# Patient Record
Sex: Female | Born: 1995 | Race: Black or African American | Hispanic: No | Marital: Single | Smoking: Never smoker
Health system: Southern US, Community
[De-identification: ages and names within clinical notes are randomized; demographics above are authoritative.]

## PROBLEM LIST (undated history)

## (undated) ENCOUNTER — Inpatient Hospital Stay (HOSPITAL_COMMUNITY): Payer: Self-pay

## (undated) DIAGNOSIS — I10 Essential (primary) hypertension: Secondary | ICD-10-CM

## (undated) HISTORY — PX: PILONIDAL CYST EXCISION: SHX744

---

## 2018-04-08 ENCOUNTER — Observation Stay (HOSPITAL_BASED_OUTPATIENT_CLINIC_OR_DEPARTMENT_OTHER): Payer: Medicaid - Out of State

## 2018-04-08 ENCOUNTER — Encounter (HOSPITAL_COMMUNITY): Payer: Self-pay

## 2018-04-08 ENCOUNTER — Other Ambulatory Visit: Payer: Self-pay

## 2018-04-08 ENCOUNTER — Observation Stay (HOSPITAL_COMMUNITY)
Admission: EM | Admit: 2018-04-08 | Discharge: 2018-04-09 | Disposition: A | Discharge status: Home / Self Care | Payer: Medicaid - Out of State | Attending: Obstetrics and Gynecology | Admitting: Obstetrics and Gynecology

## 2018-04-08 DIAGNOSIS — O1493 Unspecified pre-eclampsia, third trimester: Principal | ICD-10-CM | POA: Insufficient documentation

## 2018-04-08 DIAGNOSIS — Z3A35 35 weeks gestation of pregnancy: Secondary | ICD-10-CM | POA: Insufficient documentation

## 2018-04-08 DIAGNOSIS — I10 Essential (primary) hypertension: Secondary | ICD-10-CM | POA: Diagnosis present

## 2018-04-08 DIAGNOSIS — O1490 Unspecified pre-eclampsia, unspecified trimester: Secondary | ICD-10-CM | POA: Diagnosis present

## 2018-04-08 HISTORY — DX: Essential (primary) hypertension: I10

## 2018-04-08 LAB — CBC
HCT: 37.3 % (ref 36.0–46.0)
Hemoglobin: 11.3 g/dL — ABNORMAL LOW (ref 12.0–15.0)
MCH: 26.2 pg (ref 26.0–34.0)
MCHC: 30.3 g/dL (ref 30.0–36.0)
MCV: 86.5 fL (ref 80.0–100.0)
Platelets: 259 10*3/uL (ref 150–400)
RBC: 4.31 MIL/uL (ref 3.87–5.11)
RDW: 12.9 % (ref 11.5–15.5)
WBC: 8.5 10*3/uL (ref 4.0–10.5)
nRBC: 0 % (ref 0.0–0.2)

## 2018-04-08 LAB — TYPE AND SCREEN
ABO/RH(D): B POS
Antibody Screen: NEGATIVE

## 2018-04-08 LAB — URINALYSIS, ROUTINE W REFLEX MICROSCOPIC
Bilirubin Urine: NEGATIVE
Glucose, UA: NEGATIVE mg/dL
Hgb urine dipstick: NEGATIVE
Ketones, ur: NEGATIVE mg/dL
NITRITE: NEGATIVE
Protein, ur: NEGATIVE mg/dL
Specific Gravity, Urine: 1.004 — ABNORMAL LOW (ref 1.005–1.030)
Squamous Epithelial / HPF: >50 — ABNORMAL HIGH (ref 0–5)
WBC, UA: >50 WBC/hpf — ABNORMAL HIGH (ref 0–5)
pH: 7 (ref 5.0–8.0)

## 2018-04-08 LAB — COMPREHENSIVE METABOLIC PANEL
ALK PHOS: 128 U/L — AB (ref 38–126)
ALT: 11 U/L (ref 0–44)
ANION GAP: 11 (ref 5–15)
AST: 17 U/L (ref 15–41)
Albumin: 2.6 g/dL — ABNORMAL LOW (ref 3.5–5.0)
BUN: <5 mg/dL — ABNORMAL LOW (ref 6–20)
CO2: 22 mmol/L (ref 22–32)
Calcium: 9.1 mg/dL (ref 8.9–10.3)
Chloride: 106 mmol/L (ref 98–111)
Creatinine, Ser: 0.6 mg/dL (ref 0.44–1.00)
GFR calc non Af Amer: >60 mL/min (ref >60)
Glucose, Bld: 90 mg/dL (ref 70–99)
Potassium: 3.1 mmol/L — ABNORMAL LOW (ref 3.5–5.1)
Sodium: 139 mmol/L (ref 135–145)
Total Bilirubin: 0.5 mg/dL (ref 0.3–1.2)
Total Protein: 6.1 g/dL — ABNORMAL LOW (ref 6.5–8.1)

## 2018-04-08 LAB — PROTEIN / CREATININE RATIO, URINE
Creatinine, Urine: 40.27 mg/dL
Protein Creatinine Ratio: 0.35 mg/mg{Cre} — ABNORMAL HIGH (ref 0.00–0.15)
Total Protein, Urine: 14 mg/dL

## 2018-04-08 LAB — HEMOGLOBIN A1C
Hgb A1c MFr Bld: 4.7 % — ABNORMAL LOW (ref 4.8–5.6)
Mean Plasma Glucose: 88.19 mg/dL

## 2018-04-08 MED ORDER — ACETAMINOPHEN 325 MG PO TABS
650.0000 mg | ORAL_TABLET | Freq: Once | ORAL | Status: AC
  Administered 2018-04-08: 650 mg via ORAL
  Filled 2018-04-08: qty 2

## 2018-04-08 MED ORDER — PRENATAL MULTIVITAMIN CH
1.0000 | ORAL_TABLET | Freq: Every day | ORAL | Status: DC
  Administered 2018-04-09: 1 via ORAL
  Filled 2018-04-08: qty 1

## 2018-04-08 MED ORDER — ACETAMINOPHEN 325 MG PO TABS
650.0000 mg | ORAL_TABLET | ORAL | Status: DC | PRN
  Administered 2018-04-09 (×2): 650 mg via ORAL
  Filled 2018-04-08 (×2): qty 2

## 2018-04-08 MED ORDER — POTASSIUM CHLORIDE CRYS ER 20 MEQ PO TBCR
20.0000 meq | EXTENDED_RELEASE_TABLET | Freq: Two times a day (BID) | ORAL | Status: DC
  Administered 2018-04-08 – 2018-04-09 (×2): 20 meq via ORAL
  Filled 2018-04-08 (×5): qty 1

## 2018-04-08 MED ORDER — BUTALBITAL-APAP-CAFFEINE 50-325-40 MG PO TABS
1.0000 | ORAL_TABLET | ORAL | Status: DC | PRN
  Administered 2018-04-08: 1 via ORAL
  Filled 2018-04-08: qty 1

## 2018-04-08 MED ORDER — DOCUSATE SODIUM 100 MG PO CAPS
100.0000 mg | ORAL_CAPSULE | Freq: Every day | ORAL | Status: DC
  Administered 2018-04-09: 100 mg via ORAL
  Filled 2018-04-08: qty 1

## 2018-04-08 MED ORDER — LACTATED RINGERS IV SOLN
INTRAVENOUS | Status: DC
  Administered 2018-04-08 – 2018-04-09 (×2): via INTRAVENOUS

## 2018-04-08 MED ORDER — SODIUM CHLORIDE 0.9 % IV BOLUS
1000.0000 mL | Freq: Once | INTRAVENOUS | Status: AC
  Administered 2018-04-08: 1000 mL via INTRAVENOUS

## 2018-04-08 MED ORDER — CALCIUM CARBONATE ANTACID 500 MG PO CHEW
2.0000 | CHEWABLE_TABLET | ORAL | Status: DC | PRN
  Administered 2018-04-08 – 2018-04-09 (×2): 400 mg via ORAL
  Filled 2018-04-08 (×2): qty 2

## 2018-04-08 MED ORDER — SODIUM CHLORIDE 0.9 % IV SOLN
1.0000 g | Freq: Once | INTRAVENOUS | Status: AC
  Administered 2018-04-08: 1 g via INTRAVENOUS
  Filled 2018-04-08: qty 10

## 2018-04-08 MED ORDER — BUTALBITAL-APAP-CAFFEINE 50-325-40 MG PO TABS: 2.0000 | ORAL_TABLET | Freq: Four times a day (QID) | ORAL | Status: DC | PRN

## 2018-04-08 MED ORDER — ZOLPIDEM TARTRATE 5 MG PO TABS: 5.0000 mg | ORAL_TABLET | Freq: Every evening | ORAL | Status: DC | PRN

## 2018-04-08 NOTE — Progress Notes (Signed)
Spoke with Dr. Earlene Plateravis. Pt is rating her headache a 3 now from a 9 30- 40 min ago. [facility], [facility] for observation.

## 2018-04-08 NOTE — Progress Notes (Signed)
Dr. Rosalia Hammersay notified that pt is to be transferred to [facility], OB High Risk unit room 309.

## 2018-04-08 NOTE — ED Triage Notes (Signed)
Pt c/o HA x3 days and pain in lower abd; pt states that she doesn't feel movement as often as usual. Pt states that she feels really nauseaous

## 2018-04-08 NOTE — Progress Notes (Signed)
Spoke with Dr. Earlene Plateravis. Pt's uc's have become more regular, every 3-5 min. She does not want to give her any meds to stop uc's.

## 2018-04-08 NOTE — Progress Notes (Signed)
Spoke with Dr. Earlene Plateravis. Pt is a G1P0 at 35 5/[redacted] weeks gestation here with c/o a headache x three days and lower abd pain. FHR baseline is 145 BPM, mod variability, accels, no decels and irregular uc's. Cervix is 1cm, 50 %, ? vertex presentation at -2 station. No vaginal bleeding or leaking of fluid. Dr. Earlene Plateravis is reviewing the chart and says that if pt's headache is not relieved with tylenol, she will need [facility] for [facility].

## 2018-04-08 NOTE — ED Notes (Signed)
[facility] called for transport to [facility] 

## 2018-04-08 NOTE — Progress Notes (Signed)
Pt is a G1P0 at 35 5/[redacted] weeks gestation here with c/o headache x three days. Pt denies blurred vision or spots before her eyes at this time. She says that she has had some blurry vision and spots at times over the past three days. Pt says that she does wear glasses. She is also c/o lower abd pain and says she has not been feeling her baby move as much. [place] she says she has been getting regular prenatal care.  Pt denies vaginal bleeding or leaking of fluid. Blood pressures are elevated, but are not in the severe range. Denies epigastric pain. Reflexes are 1 plus with no clonus. Pt says she has not had a cold or any sinus problems.

## 2018-04-08 NOTE — ED Notes (Signed)
Pt checking in for lower abdominal pain, [redacted] weeks pregnant first pregnancy. She also reports decreased fetal movement. Denies vaginal bleeding or vaginal fluid.

## 2018-04-08 NOTE — H&P (Signed)
Obstetric History and Physical  Ronda Fairlyatyana Vater is a 22 y.o. G1P0 with IUP at 5451w5d presenting for not feeling well, some nausea and heartburn. Patient states she has been having  Irregular contractions, none vaginal bleeding, intact membranes, with active fetal movement.    Prenatal Course [facility] with onset of care at 14 weeks, records not available, patient reports she has had regular care and denies any issues Pregnancy complications or risks: Patient Active Problem List   Diagnosis Date Noted  . Pre-eclampsia 04/08/2018  . Chronic hypertension    Medical History:  Past Medical History:  Diagnosis Date  . Chronic hypertension     Past Surgical History:  Procedure Laterality Date  . PILONIDAL CYST EXCISION      OB History  Gravida Para Term Preterm AB Living  1            SAB TAB Ectopic Multiple Live Births               # Outcome Date GA Lbr Len/2nd Weight Sex Delivery Anes PTL Lv  1 Current             Social History   Socioeconomic History  . Marital status: Single    Spouse name: Not on file  . Number of children: Not on file  . Years of education: Not on file  . Highest education level: Not on file  Occupational History  . Not on file  Social Needs  . Financial resource strain: Not on file  . Food insecurity:    Worry: Not on file    Inability: Not on file  . Transportation needs:    Medical: Not on file    Non-medical: Not on file  Tobacco Use  . Smoking status: Never Smoker  . Smokeless tobacco: Never Used  Substance and Sexual Activity  . Alcohol use: Not on file  . Drug use: Not on file  . Sexual activity: Not on file  Lifestyle  . Physical activity:    Days per week: Not on file    Minutes per session: Not on file  . Stress: Not on file  Relationships  . Social connections:    Talks on phone: Not on file    Gets together: Not on file    Attends religious service: Not on file    Active member of club or  organization: Not on file    Attends meetings of clubs or organizations: Not on file    Relationship status: Not on file  Other Topics Concern  . Not on file  Social History Narrative  . Not on file    History reviewed. No pertinent family history.  Medications Prior to Admission  Medication Sig Dispense Refill Last Dose  . acetaminophen (TYLENOL) 500 MG tablet Take 500 mg by mouth every 6 (six) hours as needed for headache (pain).   04/08/2018 at am  . fexofenadine-pseudoephedrine (ALLEGRA-D 24) 180-240 MG 24 hr tablet Take 1 tablet by mouth daily.   04/06/2018 at am  . ondansetron (ZOFRAN) 8 MG tablet Take 8 mg by mouth every 8 (eight) hours as needed for nausea or vomiting.    04/08/2018 at am    Allergies  Allergen Reactions  . Pertussis Vaccines Rash and Other (See Comments)    High fever    Review of Systems: Negative except for what is mentioned in HPI.  Physical Exam: BP (!) 153/101 (BP Location: Right Arm)   Pulse 89  Temp 98.4 F (36.9 C) (Oral)   Resp 18   Ht 5\' 5"  (1.651 m)   Wt 99.3 kg   LMP 08/01/2017 (Exact Date) Comment: April 2019  SpO2 100%   BMI 36.44 kg/m  CONSTITUTIONAL: Well-developed, well-nourished female in no acute distress.  HENT:  Normocephalic, atraumatic, External right and left ear normal. Oropharynx is clear and moist EYES: Conjunctivae and EOM are normal. Pupils are equal, round, and reactive to light. No scleral icterus.  NECK: Normal range of motion, supple, no masses SKIN: Skin is warm and dry. No rash noted. Not diaphoretic. No erythema. No pallor. NEUROLOGIC: Alert and oriented to person, place, and time. Normal reflexes, muscle tone coordination. No cranial nerve deficit noted. PSYCHIATRIC: Normal mood and affect. Normal behavior. Normal judgment and thought content. CARDIOVASCULAR: Normal heart rate noted, regular rhythm RESPIRATORY: Effort and breath sounds normal, no problems with respiration noted ABDOMEN: Soft, nontender,  nondistended, gravid. MUSCULOSKELETAL: Normal range of motion. No edema and no tenderness. 2+ distal pulses.  Cervical Exam: 1/50/-2 Presentation: cephalic by palpation FHT:  Baseline rate 130 bpm   Variability moderate  Accelerations present   Decelerations none Contractions: occasional ctx   Pertinent Labs/Studies:   Results for orders placed or performed during the hospital encounter of 04/08/18 (from the past 24 hour(s))  CBC     Status: Abnormal   Collection Time: 04/08/18  1:20 PM  Result Value Ref Range   WBC 8.5 4.0 - 10.5 K/uL   RBC 4.31 3.87 - 5.11 MIL/uL   Hemoglobin 11.3 (L) 12.0 - 15.0 g/dL   HCT 40.9 81.1 - 91.4 %   MCV 86.5 80.0 - 100.0 fL   MCH 26.2 26.0 - 34.0 pg   MCHC 30.3 30.0 - 36.0 g/dL   RDW 78.2 95.6 - 21.3 %   Platelets 259 150 - 400 K/uL   nRBC 0.0 0.0 - 0.2 %  Comprehensive metabolic panel     Status: Abnormal   Collection Time: 04/08/18  1:20 PM  Result Value Ref Range   Sodium 139 135 - 145 mmol/L   Potassium 3.1 (L) 3.5 - 5.1 mmol/L   Chloride 106 98 - 111 mmol/L   CO2 22 22 - 32 mmol/L   Glucose, Bld 90 70 - 99 mg/dL   BUN <5 (L) 6 - 20 mg/dL   Creatinine, Ser 0.86 0.44 - 1.00 mg/dL   Calcium 9.1 8.9 - 57.8 mg/dL   Total Protein 6.1 (L) 6.5 - 8.1 g/dL   Albumin 2.6 (L) 3.5 - 5.0 g/dL   AST 17 15 - 41 U/L   ALT 11 0 - 44 U/L   Alkaline Phosphatase 128 (H) 38 - 126 U/L   Total Bilirubin 0.5 0.3 - 1.2 mg/dL   GFR calc non Af Amer >60 >60 mL/min   GFR calc Af Amer >60 >60 mL/min   Anion gap 11 5 - 15  Urinalysis, Routine w reflex microscopic     Status: Abnormal   Collection Time: 04/08/18  1:20 PM  Result Value Ref Range   Color, Urine YELLOW YELLOW   APPearance CLOUDY (A) CLEAR   Specific Gravity, Urine 1.004 (L) 1.005 - 1.030   pH 7.0 5.0 - 8.0   Glucose, UA NEGATIVE NEGATIVE mg/dL   Hgb urine dipstick NEGATIVE NEGATIVE   Bilirubin Urine NEGATIVE NEGATIVE   Ketones, ur NEGATIVE NEGATIVE mg/dL   Protein, ur NEGATIVE NEGATIVE mg/dL    Nitrite NEGATIVE NEGATIVE   Leukocytes, UA LARGE (A) NEGATIVE  RBC / HPF 11-20 0 - 5 RBC/hpf   WBC, UA >50 (H) 0 - 5 WBC/hpf   Bacteria, UA RARE (A) NONE SEEN   Squamous Epithelial / LPF >50 (H) 0 - 5   WBC Clumps PRESENT   Protein / creatinine ratio, urine     Status: Abnormal   Collection Time: 04/08/18  1:20 PM  Result Value Ref Range   Creatinine, Urine 40.27 mg/dL   Total Protein, Urine 14 mg/dL   Protein Creatinine Ratio 0.35 (H) 0.00 - 0.15 mg/mg[Cre]  Type and screen [facility]     Status: None   Collection Time: 04/08/18  4:37 PM  Result Value Ref Range   ABO/RH(D) B POS    Antibody Screen NEG    Sample Expiration      04/11/2018 Performed at [facility], [address], [place], [place] [address]     Assessment : Kadajah Kjos is a 22 y.o. G1P0 at [redacted]w[redacted]d being admitted for overnight observation for elevated BP with proteinuria. Patient reports remote history of chronic hypertension, not on meds, reports mildly elevated BP at visit 2 weeks ago but not on meds and has not been followed specially for BP. Had headache at [facility] which improved with tylenol, kept for observation given BP and headache. Will watch BP overnight and treat headache, if improves, will plan for dc home in am. If headache or BP worsens, will admit for induction of labor for chronic hypertension with superimposed preeclampsia with severe features. Patient verbalizes understanding and is in agreement with plan.  Plan:  FWB  - Cat I fetal heart tracing   - GBS pending  cHTN with si PEC - IV labetalol per protocol for BP management - MgSO4 for PEC with severe features For induction if develops severe features   K. Therese Sarah, M.D. Attending [facility] [employer] (Faculty Practice)  04/08/2018, 7:29 PM

## 2018-04-08 NOTE — ED Provider Notes (Signed)
[facility] EMERGENCY [facility]PARTMENT Provider Note   CSN: [id number] Arrival date & time: 04/08/18  1314     History   Chief Complaint Chief Complaint  Patient presents with  . Headache    HPI Susan Hooper is a 22 y.o. female.  HPI  22 year old female G1 P0 LMP 08/11/2017 35 weeks 5 days gestation presents today stating that she has been having a headache for the past 3 days.  Is generalized in nature.  She denies any previous hypertension with this pregnancy.  She feels the baby is not moving as much she has and she has some pressure in the suprapubic region.  She has been nauseated but has not vomited.  States she has been taking Zofran throughout her pregnancy due to nausea.  Not noted any fever chills, visual changes, chest pain, or changes in urinary symptoms.  She has had some increasing dyspnea.  She describes some right upper quadrant discomfort.  She has also noted increased swelling of her legs.  Reports that she has had previous prenatal care in [place].  She is here visiting.  She reports prenatal care throughout her pregnancy with no problems.  History reviewed. No pertinent past medical history.  There are no active problems to display for this patient.   The histories are not reviewed yet. Please review them in the "History" navigator section and refresh this SmartLink.   OB History    Gravida  1   Para      Term      Preterm      AB      Living        SAB      TAB      Ectopic      Multiple      Live Births               Home Medications    Prior to Admission medications   Not on File    Family History History reviewed. No pertinent family history.  Social History Social History   Tobacco Use  . Smoking status: Not on file  Substance Use Topics  . Alcohol use: Not on file  . Drug use: Not on file     Allergies   Patient has no allergy information on record.   Review of Systems Review of Systems  All  other systems reviewed and are negative.    Physical Exam Updated Vital Signs BP (!) 153/100 (BP Location: Right Arm)   Pulse 99   Temp 98.6 F (37 C) (Oral)   Resp 20   LMP  (Within Months) Comment: April 2019  SpO2 100%   Physical Exam  Constitutional: She is oriented to person, place, and time. She appears well-developed and well-nourished. She appears lethargic.  HENT:  Head: Normocephalic.  Mouth/Throat: Oropharynx is clear and moist.  Eyes: Pupils are equal, round, and reactive to light. EOM are normal.  Neck: Normal range of motion.  Cardiovascular: Normal rate and regular rhythm.  Pulmonary/Chest: Effort normal.  Abdominal: Soft. Bowel sounds are normal.  Genitourinary:  Genitourinary Comments: Gravid uterus appears small for dates without tenderness palpation  Musculoskeletal: Normal range of motion.  Neurological: She is oriented to person, place, and time. She has normal strength. She appears lethargic. She displays a negative Romberg sign. Coordination and gait normal.  Skin: Skin is warm. Capillary refill takes less than 2 seconds.  Nursing note and vitals reviewed.  Pelvic exam done per rapid response  nurse  ED Treatments / Results  Labs (all labs ordered are listed, but only abnormal results are displayed) Labs Reviewed  CBC  COMPREHENSIVE METABOLIC PANEL  URINALYSIS, ROUTINE W REFLEX MICROSCOPIC  PROTEIN / CREATININE RATIO, URINE    EKG None  Radiology No results found.  Procedures .Critical Care Performed by: Margarita Grizzle, MD Authorized by: Margarita Grizzle, MD   Critical care provider statement:    Critical care time (minutes):  45   Critical care end time:  04/08/2018 3:30 PM   Critical care was time spent personally by me on the following activities:  Discussions with consultants, evaluation of patient's response to treatment, examination of patient, ordering and performing treatments and interventions, ordering and review of laboratory  studies, ordering and review of radiographic studies, pulse oximetry, re-evaluation of patient's condition, obtaining history from patient or surrogate and review of old charts   (including critical care time)  Medications Ordered in ED Medications  sodium chloride 0.9 % bolus 1,000 mL (1,000 mLs Intravenous New Bag/Given 04/08/18 1344)     Initial Impression / Assessment and Plan / ED Course  I have reviewed the triage vital signs and the nursing notes.  Pertinent labs & imaging results that were available during my care of the patient were reviewed by me and considered in my medical decision making (see chart for details).    22 year old G1, P0 35 weeks 4 days presents today with headache, hypertension, increased protein creatinine ratio.  Headache initially 8-9 out of 10 and has decreased to 3 out of 10 with Tylenol. Liver function tests and platelets are normal. Patient with greater than 50 white blood cells in urine and large leukocytes.  However does appear contaminated.  She is given 1 g of Rocephin and 1 L of normal saline here.  Urine is cultured. Patient with concern for preeclampsia.  Blood pressure currently 139/89 without intervention.  Headache has been improving with Tylenol only. Vitals:   04/08/18 1500 04/08/18 1515  BP: 139/89 140/88  Pulse:    Resp:    Temp:    SpO2:     Patient to be transferred to [facility] Dr. Jinny Sanders service.  Final Clinical Impressions(s) / ED Diagnoses   Final diagnoses:  Pre-eclampsia in third trimester    ED Discharge Orders    None       Margarita Grizzle, MD 04/08/18 1531

## 2018-04-08 NOTE — ED Notes (Signed)
Spoke to ArlingtonMary, Charity fundraiserN (OB rapid response) [facility]. Report was called to receiving RN by her.

## 2018-04-09 DIAGNOSIS — O1493 Unspecified pre-eclampsia, third trimester: Secondary | ICD-10-CM

## 2018-04-09 LAB — URINE CULTURE

## 2018-04-09 LAB — HIV ANTIBODY (ROUTINE TESTING W REFLEX): HIV Screen 4th Generation wRfx: NONREACTIVE

## 2018-04-09 LAB — RUBELLA SCREEN: RUBELLA: 2.91 {index} (ref >0.99)

## 2018-04-09 LAB — HEPATITIS B SURFACE ANTIGEN: Hepatitis B Surface Ag: NEGATIVE

## 2018-04-09 LAB — RPR: RPR Ser Ql: NONREACTIVE

## 2018-04-09 LAB — ABO/RH: ABO/RH(D): B POS

## 2018-04-09 NOTE — Discharge Summary (Signed)
Antenatal Physician Discharge Summary  Patient ID: Ronda Fairlyatyana Bevins MRN: [id number] DOB/AGE: 1995/07/23 22 y.o.  Admit date: 04/08/2018 Discharge date: 04/09/2018  Admission Diagnoses: Preeclampsia at [redacted] weeks gestation, possible history of hypertension years ago but not currently, severe headache  Discharge Diagnoses: Preeclampsia at [redacted] weeks gestation without severe features  Prenatal Procedures: NST, Preeclampsia Evaluation and Ultrasound  Hospital Course:  This is a 22 y.o. G1P0 with IUP at 2319w6d admitted for evaluation of headache with new diagnosis of preeclampsia. See H&P for more details.  Headache improved significantly during admission and was mild at time of discharge. No reason for induction of labor at this point or treatment with magnesium sulfate.  Patient desired discharge, she will return to [place] today and follow up with primary OB. She was told she needed delivery at 37 weeks, pr earlier for signs of severe preeclampsia, these signs were reviewed with her. Also told to check BP tomorrow ,at home supermarket etc, and to go to nearest ER for BP >160/110 or any concerning symptoms of severe preeclampsia or labor symptoms.   Her fetal heart rate monitoring remained reassuring, and she had no signs/symptoms of progressing preterm labor or other maternal-fetal concerns.  She was deemed stable for discharge to home with close outpatient follow up, already has appointment scheduled on 04/12/2018.  Discharge Exam: Temp:  [98.1 F (36.7 C)-98.8 F (37.1 C)] 98.8 F (37.1 C) (12/01 1206) Pulse Rate:  [88-113] 89 (12/01 1206) Resp:  [16-20] 18 (12/01 1206) BP: (135-158)/(88-102) 158/90 (12/01 1206) SpO2:  [97 %-100 %] 100 % (12/01 1206) Weight:  [99.3 kg] 99.3 kg (11/30 1528)   Patient Vitals for the past 24 hrs:  BP Temp Temp src Pulse Resp SpO2 Height Weight  04/09/18 1206 (!) 158/90 98.8 F (37.1 C) Oral 89 18 100 % - -  04/09/18 0745 (!) 150/97 98.6 F (37 C) Oral 95  18 99 % - -  04/09/18 0336 (!) 146/88 98.1 F (36.7 C) Oral 88 16 100 % - -  04/08/18 2141 (!) 157/99 - - 97 - 100 % - -  04/08/18 2113 (!) 152/102 - - (!) 103 - 97 % - -  04/08/18 1937 (!) 156/89 98.3 F (36.8 C) Oral (!) 113 17 100 % - -  04/08/18 1616 (!) 153/101 98.4 F (36.9 C) Oral 89 18 100 % - -  04/08/18 1530 135/88 - - 95 18 99 % - -  04/08/18 1528 - - - - - - 5\' 5"  (1.651 m) 99.3 kg  04/08/18 1515 140/88 - - - - - - -  04/08/18 1500 139/89 - - - - - - -  04/08/18 1326 (!) 153/100 98.6 F (37 C) Oral 99 20 100 % - -    Physical Examination: CONSTITUTIONAL: Well-developed, well-nourished female in no acute distress.  HENT:  Normocephalic, atraumatic, External right and left ear normal. Oropharynx is clear and moist EYES: Conjunctivae and EOM are normal. Pupils are equal, round, and reactive to light. No scleral icterus.  NECK: Normal range of motion, supple, no masses SKIN: Skin is warm and dry. No rash noted. Not diaphoretic. No erythema. No pallor. NEUROLGIC: Alert and oriented to person, place, and time. Normal reflexes, muscle tone coordination. No cranial nerve deficit noted. PSYCHIATRIC: Normal mood and affect. Normal behavior. Normal judgment and thought content. CARDIOVASCULAR: Normal heart rate noted, regular rhythm RESPIRATORY: Effort and breath sounds normal, no problems with respiration noted MUSCULOSKELETAL: Normal range of motion. No edema and no  tenderness. 2+ distal pulses. ABDOMEN: Soft, nontender, nondistended, gravid. CERVIX: Dilation: 1 Effacement (%): 50 Cervical Position: Middle Station: -2 Presentation: Undeterminable(? vertex) Exam by:: Mary Early RNC-OB  Fetal monitoring: FHR: 135 bpm, Variability: moderate, Accelerations: Present, Decelerations: Absent  Uterine activity: No contractions   Significant Diagnostic Studies:  Results for orders placed or performed during the hospital encounter of 04/08/18 (from the past 168 hour(s))  CBC    Collection Time: 04/08/18  1:20 PM  Result Value Ref Range   WBC 8.5 4.0 - 10.5 K/uL   RBC 4.31 3.87 - 5.11 MIL/uL   Hemoglobin 11.3 (L) 12.0 - 15.0 g/dL   HCT 16.1 09.6 - 04.5 %   MCV 86.5 80.0 - 100.0 fL   MCH 26.2 26.0 - 34.0 pg   MCHC 30.3 30.0 - 36.0 g/dL   RDW 40.9 81.1 - 91.4 %   Platelets 259 150 - 400 K/uL   nRBC 0.0 0.0 - 0.2 %  Comprehensive metabolic panel   Collection Time: 04/08/18  1:20 PM  Result Value Ref Range   Sodium 139 135 - 145 mmol/L   Potassium 3.1 (L) 3.5 - 5.1 mmol/L   Chloride 106 98 - 111 mmol/L   CO2 22 22 - 32 mmol/L   Glucose, Bld 90 70 - 99 mg/dL   BUN <5 (L) 6 - 20 mg/dL   Creatinine, Ser 7.82 0.44 - 1.00 mg/dL   Calcium 9.1 8.9 - 95.6 mg/dL   Total Protein 6.1 (L) 6.5 - 8.1 g/dL   Albumin 2.6 (L) 3.5 - 5.0 g/dL   AST 17 15 - 41 U/L   ALT 11 0 - 44 U/L   Alkaline Phosphatase 128 (H) 38 - 126 U/L   Total Bilirubin 0.5 0.3 - 1.2 mg/dL   GFR calc non Af Amer >60 >60 mL/min   GFR calc Af Amer >60 >60 mL/min   Anion gap 11 5 - 15  Urinalysis, Routine w reflex microscopic   Collection Time: 04/08/18  1:20 PM  Result Value Ref Range   Color, Urine YELLOW YELLOW   APPearance CLOUDY (A) CLEAR   Specific Gravity, Urine 1.004 (L) 1.005 - 1.030   pH 7.0 5.0 - 8.0   Glucose, UA NEGATIVE NEGATIVE mg/dL   Hgb urine dipstick NEGATIVE NEGATIVE   Bilirubin Urine NEGATIVE NEGATIVE   Ketones, ur NEGATIVE NEGATIVE mg/dL   Protein, ur NEGATIVE NEGATIVE mg/dL   Nitrite NEGATIVE NEGATIVE   Leukocytes, UA LARGE (A) NEGATIVE   RBC / HPF 11-20 0 - 5 RBC/hpf   WBC, UA >50 (H) 0 - 5 WBC/hpf   Bacteria, UA RARE (A) NONE SEEN   Squamous Epithelial / LPF >50 (H) 0 - 5   WBC Clumps PRESENT   Protein / creatinine ratio, urine   Collection Time: 04/08/18  1:20 PM  Result Value Ref Range   Creatinine, Urine 40.27 mg/dL   Total Protein, Urine 14 mg/dL   Protein Creatinine Ratio 0.35 (H) 0.00 - 0.15 mg/mg[Cre]  Urine Culture   Collection Time: 04/08/18  2:30 PM    Result Value Ref Range   Specimen Description URINE, CLEAN CATCH    Special Requests      NONE Performed at [facility], [address], [place], [place] [address]    Culture MULTIPLE SPECIES PRESENT, SUGGEST RECOLLECTION (A)    Report Status 04/09/2018 FINAL   RPR   Collection Time: 04/08/18  4:37 PM  Result Value Ref Range   RPR Ser Ql  Non Reactive Non Reactive  HIV Antibody (routine testing w rflx)   Collection Time: 04/08/18  4:37 PM  Result Value Ref Range   HIV Screen 4th Generation wRfx Non Reactive Non Reactive  Hepatitis B surface antigen   Collection Time: 04/08/18  4:37 PM  Result Value Ref Range   Hepatitis B Surface Ag Negative Negative  Rubella screen   Collection Time: 04/08/18  4:37 PM  Result Value Ref Range   Rubella 2.91 Immune >0.99 index  Hemoglobin A1c   Collection Time: 04/08/18  4:37 PM  Result Value Ref Range   Hgb A1c MFr Bld 4.7 (L) 4.8 - 5.6 %   Mean Plasma Glucose 88.19 mg/dL  Type and screen [facility] [place]   Collection Time: 04/08/18  4:37 PM  Result Value Ref Range   ABO/RH(D) B POS    Antibody Screen NEG    Sample Expiration      04/11/2018 Performed at [facility], [address] [place], [place] [address]   ABO/Rh   Collection Time: 04/08/18  4:37 PM  Result Value Ref Range   ABO/RH(D)      B POS Performed at [facility], [address], [place], [place] [address]      Discharge Condition: Stable    Discharge disposition: 01-Home or Self Care   Allergies as of 04/09/2018      Reactions   Pertussis Vaccines Rash, Other (See Comments)   High fever      Medication List    STOP taking these medications   fexofenadine-pseudoephedrine 180-240 MG 24 hr tablet Commonly known as:  ALLEGRA-D 24   ondansetron 8 MG tablet Commonly known as:  ZOFRAN     TAKE these medications   acetaminophen 500 MG tablet Commonly known as:  TYLENOL Take 500 mg by mouth every 6 (six) hours as needed for  headache (pain).      Follow-up Information    OB Provider in [place] Follow up on 04/12/2018.   Why:  Already scheduled appointment          Signed: Jaynie Collins M.D. 04/09/2018, 1:01 PM

## 2018-04-09 NOTE — Discharge Instructions (Signed)
YOU NEED TO BE SEEN BY YOUR OB PROVIDER THIS WEEK  YOU NEED TO BE DELIVERED AT [redacted] WEEKS GESTATION FOR PREECLAMPSIA OR EARLIER IF NEEDED FOR WORSENING SYMPTOMS (SIGNS AS BELOW) OR FOR BP> 160/110.            Preeclampsia and Eclampsia Preeclampsia is a serious condition that develops only during pregnancy. It is also called toxemia of pregnancy. This condition causes high blood pressure along with other symptoms, such as swelling and headaches. These symptoms may develop as the condition gets worse. Preeclampsia may occur at 20 weeks of pregnancy or later. Diagnosing and treating preeclampsia early is very important. If not treated early, it can cause serious problems for you and your baby. One problem it can lead to is eclampsia, which is a condition that causes muscle jerking or shaking (convulsions or seizures) in the mother. Delivering your baby is the best treatment for preeclampsia or eclampsia. Preeclampsia and eclampsia symptoms usually go away after your baby is born. What are the causes? The cause of preeclampsia is not known. What increases the risk? The following risk factors make you more likely to develop preeclampsia:  Being pregnant for the first time.  Having had preeclampsia during a past pregnancy.  Having a family history of preeclampsia.  Having high blood pressure.  Being pregnant with twins or triplets.  Being 3435 or older.  Being African-American.  Having kidney disease or diabetes.  Having medical conditions such as lupus or blood diseases.  Being very overweight (obese).  What are the signs or symptoms? The earliest signs of preeclampsia are:  High blood pressure.  Increased protein in your urine. Your health care provider will check for this at every visit before you give birth (prenatal visit).  Other symptoms that may develop as the condition gets worse include:  Severe headaches.  Sudden weight gain.  Swelling of the hands, face,  legs, and feet.  Nausea and vomiting.  Vision problems, such as blurred or double vision.  Numbness in the face, arms, legs, and feet.  Urinating less than usual.  Dizziness.  Slurred speech.  Abdominal pain, especially upper abdominal pain.  Convulsions or seizures.  Symptoms generally go away after giving birth. How is this diagnosed? There are no screening tests for preeclampsia. Your health care provider will ask you about symptoms and check for signs of preeclampsia during your prenatal visits. You may also have tests that include:  Urine tests.  Blood tests.  Checking your blood pressure.  Monitoring your babys heart rate.  Ultrasound.  How is this treated? You and your health care provider will determine the treatment approach that is best for you. Treatment may include:  Having more frequent prenatal exams to check for signs of preeclampsia, if you have an increased risk for preeclampsia.  Bed rest.  Reducing how much salt (sodium) you eat.  Medicine to lower your blood pressure.  Staying in the hospital, if your condition is severe. There, treatment will focus on controlling your blood pressure and the amount of fluids in your body (fluid retention).  You may need to take medicine (magnesium sulfate) to prevent seizures. This medicine may be given as an injection or through an IV tube.  Delivering your baby early, if your condition gets worse. You may have your labor started with medicine (induced), or you may have a cesarean delivery.  Follow these instructions at home: Eating and drinking   Drink enough fluid to keep your urine clear or pale yellow.  Eat a healthy diet that is low in sodium. Do not add salt to your food. Check nutrition labels to see how much sodium a food or beverage contains.  Avoid caffeine. Lifestyle  Do not use any products that contain nicotine or tobacco, such as cigarettes and e-cigarettes. If you need help quitting, ask  your health care provider.  Do not use alcohol or drugs.  Avoid stress as much as possible. Rest and get plenty of sleep. General instructions  Take over-the-counter and prescription medicines only as told by your health care provider.  When lying down, lie on your side. This keeps pressure off of your baby.  When sitting or lying down, raise (elevate) your feet. Try putting some pillows underneath your lower legs.  Exercise regularly. Ask your health care provider what kinds of exercise are best for you.  Keep all follow-up and prenatal visits as told by your health care provider. This is important. How is this prevented? To prevent preeclampsia or eclampsia from developing during another pregnancy:  Get proper medical care during pregnancy. Your health care provider may be able to prevent preeclampsia or diagnose and treat it early.  Your health care provider may have you take a low-dose aspirin or a calcium supplement during your next pregnancy.  You may have tests of your blood pressure and kidney function after giving birth.  Maintain a healthy weight. Ask your health care provider for help managing weight gain during pregnancy.  Work with your health care provider to manage any long-term (chronic) health conditions you have, such as diabetes or kidney problems.  Contact a health care provider if:  You gain more weight than expected.  You have headaches.  You have nausea or vomiting.  You have abdominal pain.  You feel dizzy or light-headed. Get help right away if:  You develop sudden or severe swelling anywhere in your body. This usually happens in the legs.  You gain 5 lbs (2.3 kg) or more during one week.  You have severe: ? Abdominal pain. ? Headaches. ? Dizziness. ? Vision problems. ? Confusion. ? Nausea or vomiting.  You have a seizure.  You have trouble moving any part of your body.  You develop numbness in any part of your body.  You have  trouble speaking.  You have any abnormal bleeding.  You pass out. This information is not intended to replace advice given to you by your health care provider. Make sure you discuss any questions you have with your health care provider. Document Released: 04/23/2000 Document Revised: 12/23/2015 Document Reviewed: 12/01/2015 [employer]vier Interactive Patient Education  [employer].

## 2018-04-11 LAB — CULTURE, BETA STREP (GROUP B ONLY)

## 2018-07-31 ENCOUNTER — Encounter (HOSPITAL_COMMUNITY): Payer: Self-pay

## 2020-02-16 IMAGING — US US MFM OB COMP +14 WKS
1 series · 13 of 28 positions shown · non-contrast
Comparison: none

[Series 1: us mfm ob comp +14 wks · 13 of 57 slices shown]
[im 3/57]
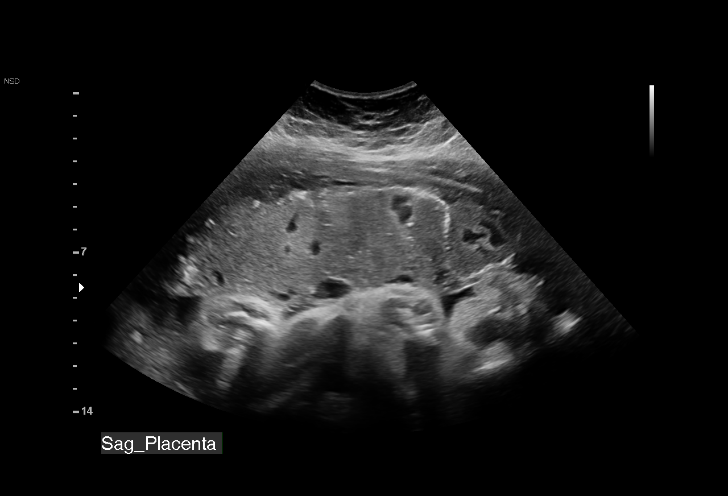
[im 7/57]
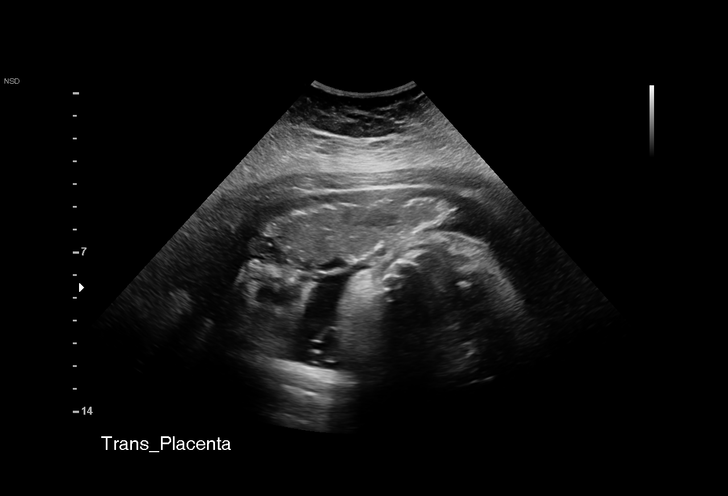
[im 11/57]
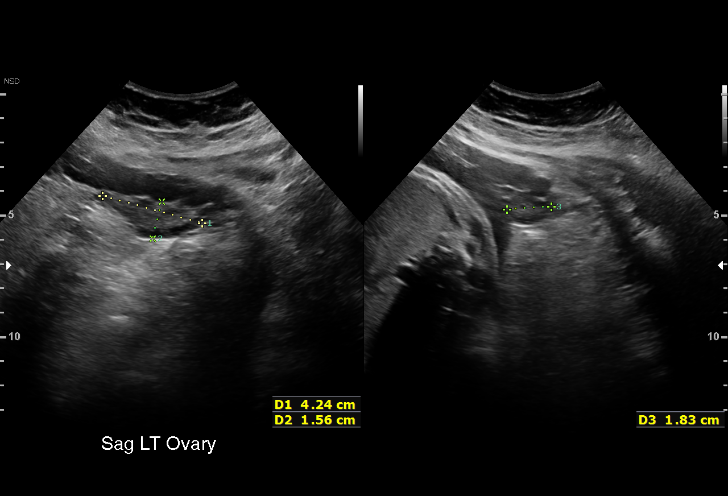
[im 15/57]
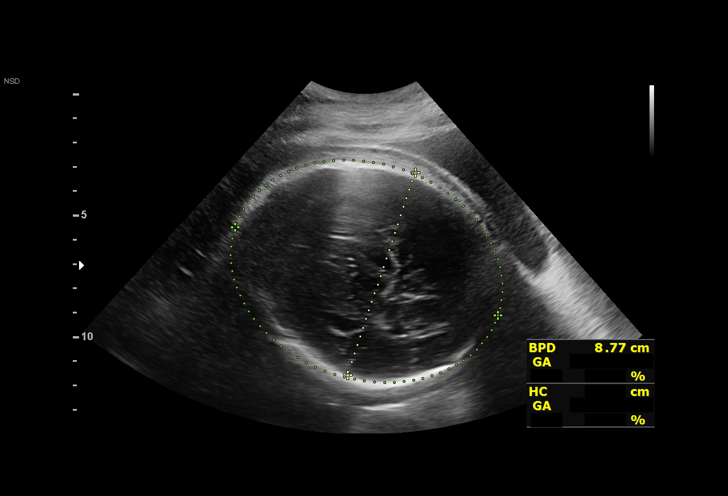
[im 19/57]
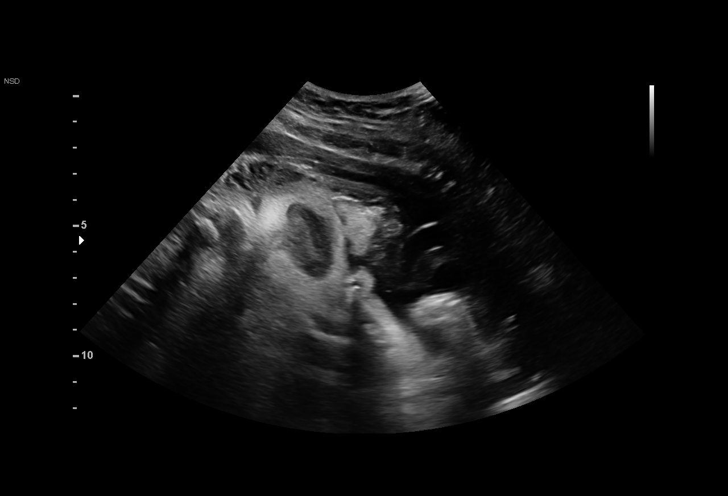
[im 23/57]
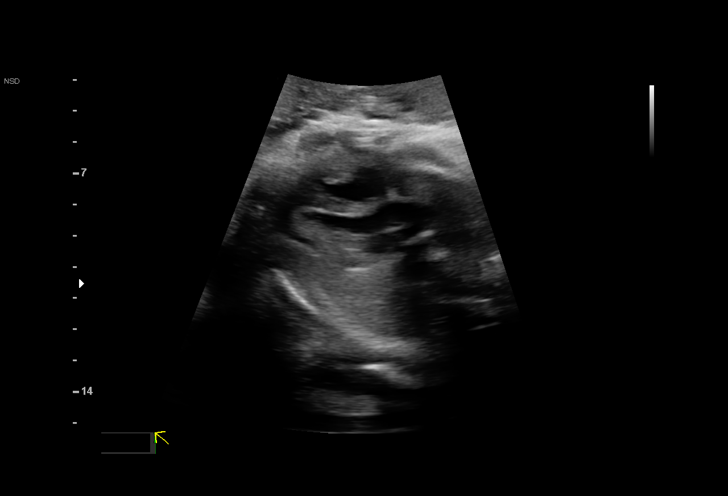
[im 30/57]
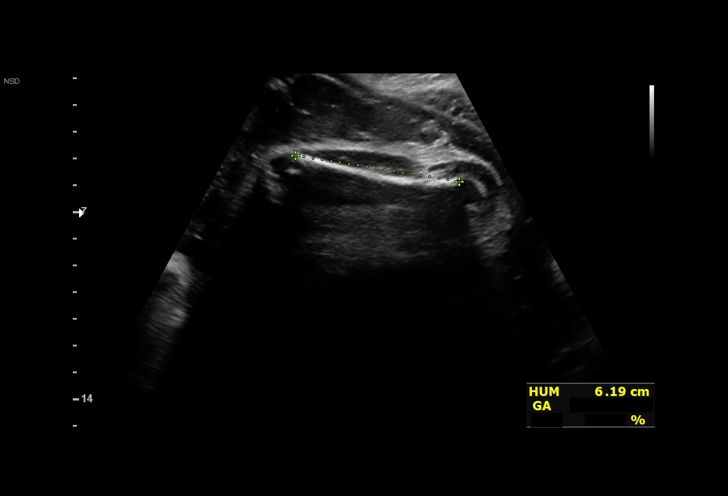
[im 34/57]
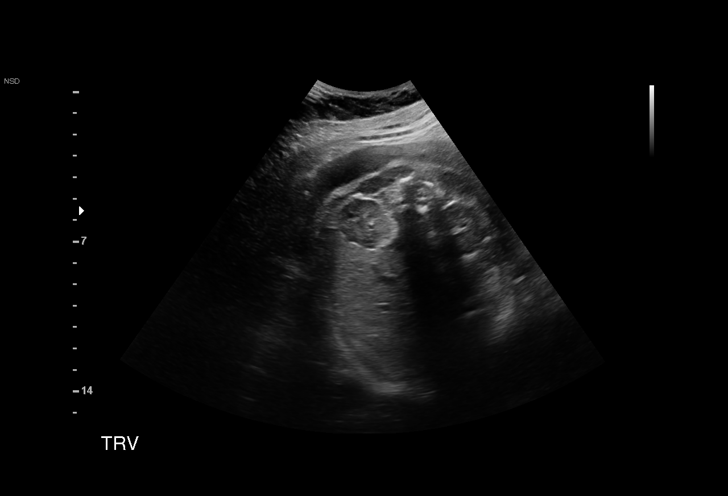
[im 38/57]
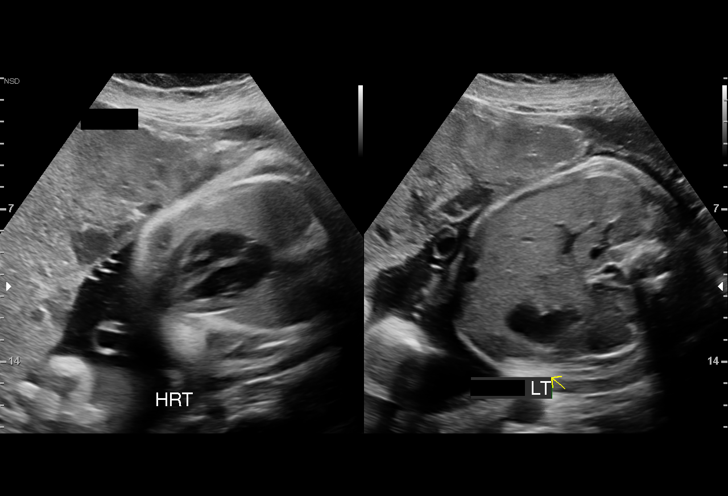
[im 42/57]
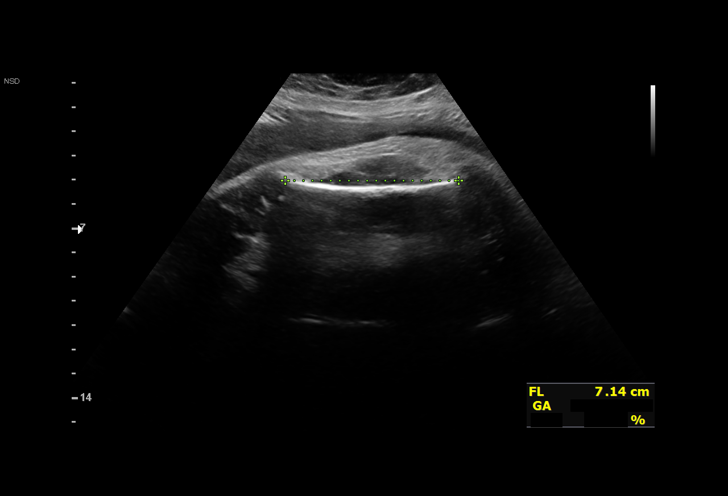
[im 46/57]
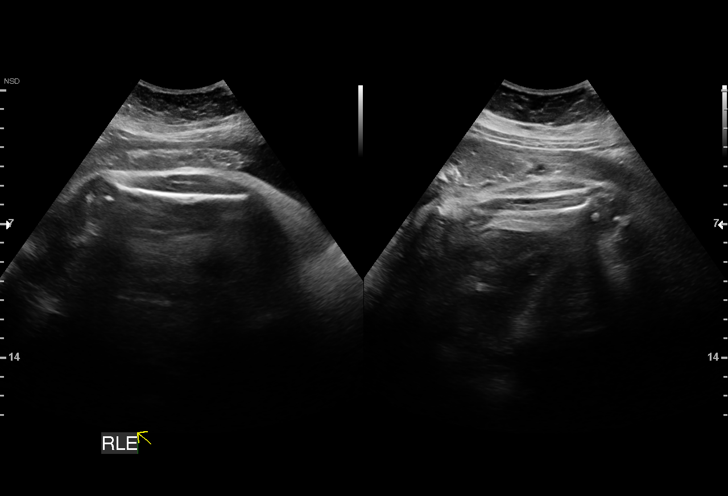
[im 50/57]
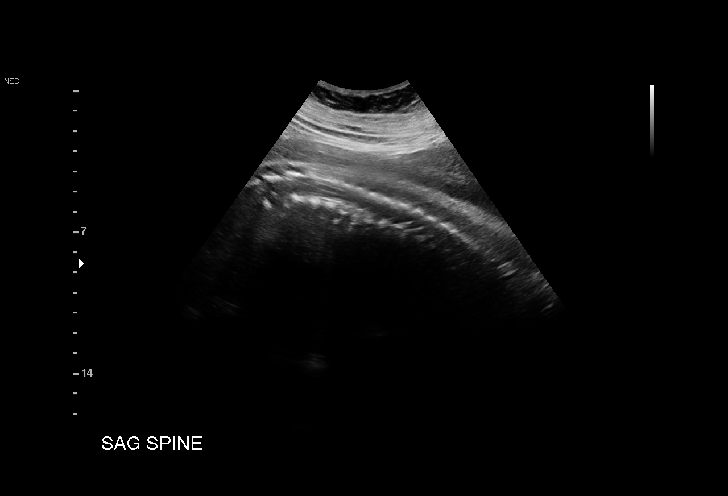
[im 54/57]
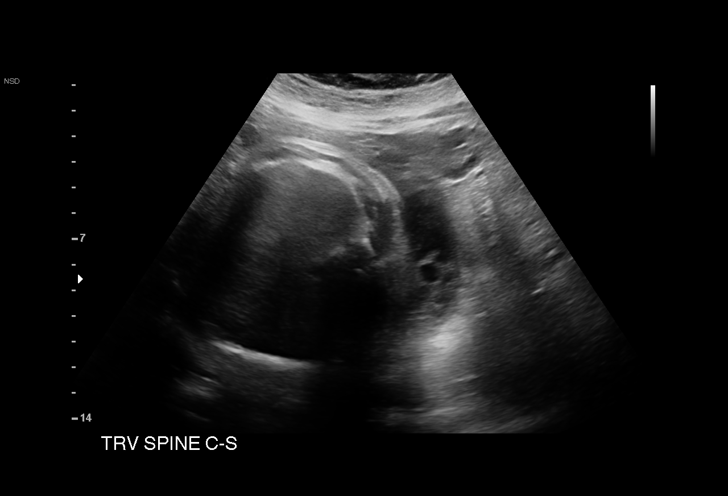

[13 of 28 positions shown; findings below may reference images not displayed]

Attending:        Conilious Ripfumelo      Secondary Phy.:    [facility]
                                                             OB

 ----------------------------------------------------------------------

 ----------------------------------------------------------------------
Indications

  35 weeks gestation of pregnancy
  Unspecified preeclampsia, third trimester
 ----------------------------------------------------------------------
Fetal Evaluation

 Num Of Fetuses:          1
 Fetal Heart Rate(bpm):   150
 Cardiac Activity:        Observed
 Presentation:            Cephalic
 Placenta:                Anterior
 P. Cord Insertion:       Not well visualized

 Amniotic Fluid
 AFI FV:      Within normal limits

 AFI Sum(cm)     %Tile       Largest Pocket(cm)
 9.82            20

 RUQ(cm)       RLQ(cm)       LUQ(cm)        LLQ(cm)
 2.43          0
Biometry

 BPD:      88.9  mm     G. Age:  36w 0d         64  %    CI:        77.14   %    70 - 86
                                                         FL/HC:       21.7  %    20.1 -
 HC:      320.5  mm     G. Age:  36w 1d         29  %    HC/AC:       0.97       0.93 -
 AC:       331   mm     G. Age:  37w 0d         88  %    FL/BPD:      78.4  %    71 - 87
 FL:       69.7  mm     G. Age:  35w 5d         46  %    FL/AC:       21.1  %    20 - 24
 HUM:      63.1  mm     G. Age:  36w 5d         80  %

 LV:        4.4  mm

 Est. FW:    3123   gm     6 lb 8 oz     78  %
OB History

 Gravidity:    1         Term:   0        Prem:   0        SAB:   0
 TOP:          0       Ectopic:  0        Living: 0
Gestational Age

 Clinical EDD:  35w 5d                                        EDD:   05/08/18
 U/S Today:     36w 2d                                        EDD:   05/04/18
 Best:          35w 5d     Det. By:  Clinical EDD             EDD:   05/08/18
Anatomy

 Cranium:               Appears normal         LVOT:                   Appears normal
 Cavum:                 Appears normal         Aortic Arch:            Not well visualized
 Ventricles:            Appears normal         Ductal Arch:            Not well visualized
 Choroid Plexus:        Not well visualized    Diaphragm:              Appears normal
 Cerebellum:            Not well visualized    Stomach:                Appears normal, left
                                                                       sided
 Posterior Fossa:       Not well visualized    Abdomen:                Appears normal
 Nuchal Fold:           Not applicable (>20    Abdominal Wall:         Appears nml (cord
                        wks GA)                                        insert, abd wall)
 Face:                  Orbits appear          Cord Vessels:           Appears normal (3
                        normal                                         vessel cord)
 Lips:                  Appears normal         Kidneys:                Appear normal
 Palate:                Not well visualized    Bladder:                Appears normal
 Thoracic:              Appears normal         Spine:                  Limited views
                                                                       appear normal
 Heart:                 Appears normal         Upper Extremities:      Present
                        (4CH, axis, and situs
 RVOT:                  Not well visualized    Lower Extremities:      Present

 Other:  Technically difficult due to advanced gestational age.
Cervix Uterus Adnexa

 Cervix
 Not visualized (advanced GA >93wks)

 Left Ovary
 Within normal limits.

 Right Ovary
 Within normal limits.

 Adnexa
 No abnormality visualized.
Impression

 Normal interval growth.  No ultrasonic evidence of structural
 fetal anomalies.
 Suboptimal views of the posterior fossa, heart and spine were
 obtained today secondary to fetal position.
Recommendations

 Follow up anatomy in 4-5 weeks.
